# Patient Record
Sex: Male | Born: 2000 | Race: White | Hispanic: No | Marital: Single | Smoking: Never smoker
Health system: Southern US, Community
[De-identification: ages and names within clinical notes are randomized; demographics above are authoritative.]

---
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
## 2023-01-28 ENCOUNTER — Emergency Department (HOSPITAL_BASED_OUTPATIENT_CLINIC_OR_DEPARTMENT_OTHER): Payer: BC Managed Care – PPO

## 2023-01-28 ENCOUNTER — Emergency Department (HOSPITAL_BASED_OUTPATIENT_CLINIC_OR_DEPARTMENT_OTHER)
Admission: EM | Admit: 2023-01-28 | Discharge: 2023-01-28 | Disposition: A | Discharge status: Home / Self Care | Payer: BC Managed Care – PPO | Attending: Emergency Medicine | Admitting: Emergency Medicine

## 2023-01-28 ENCOUNTER — Encounter (HOSPITAL_BASED_OUTPATIENT_CLINIC_OR_DEPARTMENT_OTHER): Payer: Self-pay

## 2023-01-28 ENCOUNTER — Other Ambulatory Visit: Payer: Self-pay

## 2023-01-28 DIAGNOSIS — Y9351 Activity, roller skating (inline) and skateboarding: Secondary | ICD-10-CM | POA: Diagnosis not present

## 2023-01-28 DIAGNOSIS — S8001XA Contusion of right knee, initial encounter: Secondary | ICD-10-CM | POA: Diagnosis not present

## 2023-01-28 DIAGNOSIS — S8991XA Unspecified injury of right lower leg, initial encounter: Secondary | ICD-10-CM | POA: Diagnosis present

## 2023-01-28 MED ORDER — IBUPROFEN 400 MG PO TABS
600.0000 mg | ORAL_TABLET | Freq: Once | ORAL | Status: AC
  Administered 2023-01-28: 600 mg via ORAL
  Filled 2023-01-28: qty 1

## 2023-01-28 MED ORDER — ACETAMINOPHEN 500 MG PO TABS
1000.0000 mg | ORAL_TABLET | Freq: Once | ORAL | Status: AC
  Administered 2023-01-28: 1000 mg via ORAL
  Filled 2023-01-28: qty 2

## 2023-01-28 NOTE — Discharge Instructions (Addendum)
You were seen in the emergency room today for evaluation of your right knee pain.  The x-ray did not show any broken bones however you do have some swelling or effusion.  This is usually treated with the RICE method that we discussed earlier.  Additional information on this included in the discharge paperwork for you to review.  For pain, I recommend 600 mg of ibuprofen and or 1000 mg of Tylenol every 6 hours as needed for pain.  It is important that you stay off your feet and elevate your leg is much as possible to help with the swelling and also to help with the pain.  As previously discussed, there are tendons and ligaments that are not seen on x-ray imaging that may have been damage.  If you continue to have any pain, please make sure you follow-up with an orthopedic specialist.  I have included one in the discharge report for you.  Please call to schedule an appointment. If the swelling doesn't improve, you may have to have the knee drained which is why it is important to follow up with orthopedics.  If you develop any increase in redness or warmth to the area, worsening range of motion or worsening pain, fever, you need to return to the emerged apartment for reevaluation.  If you have any other concerns, new or worsening symptoms, please return to your nearest emergency department for reevaluation.  Contact a doctor if: The knee pain does not stop. The knee pain changes or gets worse. You have a fever along with knee pain. Your knee is red or feels warm when you touch it. Your knee gives out or locks up. Get help right away if: Your knee swells, and the swelling gets worse. You cannot move your knee. You have very bad knee pain that does not get better with pain medicine.

## 2023-01-28 NOTE — ED Triage Notes (Signed)
Pt reports riding skateboard. Falling and landing on right knee. Knee swollen

## 2023-01-28 NOTE — ED Provider Notes (Signed)
[facility] Provider Note   CSN: [id number] Arrival date & time: 01/28/23  6578     History Chief Complaint  Patient presents with   Knee Injury    Kristy Nygren is a 22 y.o. male reportedly otherwise healthy presents to the ER today for evaluation of right knee pain after skateboarding incident.  Patient was not wearing his helmet however he assures me that he did not hit his head.  He reports that he was on a skateboard and fell off and landed on his right knee.  This happened last night.  He has been able to walk however does have pain to the knee.  He did not try any medications, ice, or elevation when he got home.  He reports that he continued to have pain and swelling into the morning and came into the emergency department.  He denies any fevers or chills at home.  Denies any IV drug use ever.  He denies any numbness or tingling into the lower extremities.  He denies any other injury or pain.  Not on any blood thinners.  HPI     Home Medications Prior to Admission medications   Not on File      Allergies    Patient has no known allergies.    Review of Systems   Review of Systems  Constitutional:  Negative for chills and fever.  Musculoskeletal:  Positive for arthralgias and joint swelling.  Neurological:  Negative for numbness.    Physical Exam Updated Vital Signs BP 127/88   Pulse 63   Temp 98.3 F (36.8 C)   Resp 16   Ht 6\' 3"  (1.905 m)   Wt 72.6 kg   SpO2 96%   BMI 20.00 kg/m  Physical Exam Vitals and nursing note reviewed.  Constitutional:      General: He is not in acute distress.    Appearance: He is not toxic-appearing.  Eyes:     General: No scleral icterus. Cardiovascular:     Rate and Rhythm: Normal rate.  Pulmonary:     Effort: Pulmonary effort is normal. No respiratory distress.  Musculoskeletal:        General: Swelling and tenderness present.     Right lower leg: No edema.     Left lower  leg: No edema.     Comments: Swelling present to the superior aspect of the anterior right knee.  No overlying erythema or warmth.  He does have a small contusion/ecchymosis present to the more inferior medial aspect of the knee.  Tenderness is more to the medial aspect of the knee.  He does not have any tenderness to the inferior or lateral aspect. Compartments are soft.  No tenderness into the quadricep or hamstring.  Patient is able to fully extend the knee and does have some pain with flexion.  Sensation reportedly intact and symmetric.  Palpable DP and PT pulses bilaterally.  Brisk cap refill.  Skin:    General: Skin is warm and dry.  Neurological:     Mental Status: He is alert.     ED Results / Procedures / Treatments   Labs (all labs ordered are listed, but only abnormal results are displayed) Labs Reviewed - No data to display  EKG None  Radiology DG Knee Complete 4 Views Right  Result Date: 01/28/2023 CLINICAL DATA:  Knee trauma with pain. EXAM: RIGHT KNEE - COMPLETE 4+ VIEW COMPARISON:  None Available. FINDINGS: No evidence of fracture or  dislocation. There is a moderate to large knee joint effusion. No evidence of arthropathy or other focal bone abnormality. There is soft tissue swelling of the anterior thigh and knee. IMPRESSION: Moderate to large knee joint effusion without evidence of fracture. Electronically Signed   By: Romona Curls M.D.   On: 01/28/2023 09:25    Procedures Procedures   Medications Ordered in ED Medications  ibuprofen (ADVIL) tablet 600 mg (600 mg Oral Given 01/28/23 0950)  acetaminophen (TYLENOL) tablet 1,000 mg (1,000 mg Oral Given 01/28/23 4098)    ED Course/ Medical Decision Making/ A&P   Medical Decision Making Amount and/or Complexity of Data Reviewed Radiology: ordered.  Risk OTC drugs.   22 y.o. male presents to the ER for evaluation of right knee pain. Differential diagnosis includes but is not limited to contusion, fracture,  dislocation, tendon/ligament damage. Vital signs unremarkable. Physical exam as noted above.   On physical examination of the right knee, the patient has a joint effusion but is more to the superior aspect of the knee.  Has some tenderness to superior aspect however more medially.  There is some slight bruising noted to the area.  Patient still has full extension of the knee and has some pain more with flexion.  There is no tenderness into the quadriceps, hamstring, or into the more inferior aspect of the knee.  Compartments are soft throughout.  He has palpable DP and PT pulses that are symmetric bilaterally.  Brisk cap refill present.  Sensation reportedly intact and symmetric.  XR imaging shows moderate to large knee joint effusion without evidence of fracture.  I do not think this is any septic arthritis.  There is no overlying erythema or warmth.  There is likely significant swelling due to the contusion from the fall.  He does not have any tenderness to the inferior aspect of the knee, the lower excision for any tibial plateau fracture.  More tenderness to the medial aspect and swelling and effusions more anteriorly.  He is afebrile, denies any IV drug use and does not have any overlying warmth or erythema to the area.  He could possibly have some tendon ligament injury which is why recommended follow-up with orthopedics.  He was given Ace bandage here and crutches as dad reports they have a knee sleeve at home.  We discussed pain medication with Tylenol and ibuprofen as well as the RICE method.  I discussed with him the importance of following with orthopedics as sometimes the swelling does not go down on its own and has to be removed.  Also discussed that he may have some tendon ligament damage which is why needs to follow-up with orthopedics.  Patient and parent verbalized understanding.  He is stable for discharge home with close orthopedic follow-up.  We discussed the results of the labs/imaging.  The plan is . We discussed strict return precautions and red flag symptoms. The patient verbalized their understanding and agrees to the plan. The patient is stable and being discharged home in good condition.  Portions of this report may have been transcribed using voice recognition software. Every effort was made to ensure accuracy; however, inadvertent computerized transcription errors may be present.   Final Clinical Impression(s) / ED Diagnoses Final diagnoses:  Contusion of right knee, initial encounter    Rx / DC Orders ED Discharge Orders     None         Achille Rich, Cordelia Poche 01/28/23 2056    Jacalyn Lefevre, MD 01/31/23 [phone]

## 2023-01-28 NOTE — ED Notes (Signed)
Pt alert and oriented X 4 at the time of discharge. RR even and unlabored. No acute distress noted. Pt verbalized understanding of discharge instructions as discussed. Pt ambulatory to lobby at time of discharge.
